# Patient Record
Sex: Female | Born: 1970 | Race: White | Hispanic: No | Marital: Single | Smoking: Never smoker
Health system: Southern US, Community
[De-identification: ages and names within clinical notes are randomized; demographics above are authoritative.]

---

## 1997-12-29 ENCOUNTER — Other Ambulatory Visit: Admission: RE | Admit: 1997-12-29 | Discharge: 1997-12-29 | Payer: Self-pay | Admitting: Obstetrics and Gynecology

## 1999-01-07 ENCOUNTER — Other Ambulatory Visit: Admission: RE | Admit: 1999-01-07 | Discharge: 1999-01-07 | Payer: Self-pay | Admitting: *Deleted

## 2000-01-14 ENCOUNTER — Other Ambulatory Visit: Admission: RE | Admit: 2000-01-14 | Discharge: 2000-01-14 | Payer: Self-pay | Admitting: *Deleted

## 2000-04-29 ENCOUNTER — Other Ambulatory Visit: Admission: RE | Admit: 2000-04-29 | Discharge: 2000-04-29 | Payer: Self-pay | Admitting: Obstetrics and Gynecology

## 2000-06-02 ENCOUNTER — Encounter: Admission: RE | Admit: 2000-06-02 | Discharge: 2000-08-31 | Payer: Self-pay | Admitting: Obstetrics and Gynecology

## 2000-09-29 ENCOUNTER — Inpatient Hospital Stay (HOSPITAL_COMMUNITY): Admission: AD | Admit: 2000-09-29 | Discharge: 2000-09-29 | Payer: Self-pay | Admitting: Obstetrics and Gynecology

## 2000-10-06 ENCOUNTER — Encounter (HOSPITAL_COMMUNITY): Admission: RE | Admit: 2000-10-06 | Discharge: 2000-10-25 | Payer: Self-pay | Admitting: Obstetrics and Gynecology

## 2000-10-23 ENCOUNTER — Inpatient Hospital Stay (HOSPITAL_COMMUNITY): Admission: AD | Admit: 2000-10-23 | Discharge: 2000-10-27 | Payer: Self-pay | Admitting: *Deleted

## 2000-10-28 ENCOUNTER — Encounter: Admission: RE | Admit: 2000-10-28 | Discharge: 2000-11-27 | Payer: Self-pay | Admitting: Obstetrics and Gynecology

## 2000-10-31 ENCOUNTER — Inpatient Hospital Stay (HOSPITAL_COMMUNITY): Admission: AD | Admit: 2000-10-31 | Discharge: 2000-10-31 | Payer: Self-pay | Admitting: Obstetrics and Gynecology

## 2000-11-28 ENCOUNTER — Encounter: Admission: RE | Admit: 2000-11-28 | Discharge: 2000-12-28 | Payer: Self-pay | Admitting: Obstetrics and Gynecology

## 2000-12-03 ENCOUNTER — Other Ambulatory Visit: Admission: RE | Admit: 2000-12-03 | Discharge: 2000-12-03 | Payer: Self-pay | Admitting: Obstetrics and Gynecology

## 2000-12-29 ENCOUNTER — Encounter: Admission: RE | Admit: 2000-12-29 | Discharge: 2001-01-28 | Payer: Self-pay | Admitting: Obstetrics and Gynecology

## 2001-12-23 ENCOUNTER — Other Ambulatory Visit: Admission: RE | Admit: 2001-12-23 | Discharge: 2001-12-23 | Payer: Self-pay | Admitting: Obstetrics and Gynecology

## 2003-03-24 ENCOUNTER — Other Ambulatory Visit: Admission: RE | Admit: 2003-03-24 | Discharge: 2003-03-24 | Payer: Self-pay | Admitting: Obstetrics and Gynecology

## 2011-04-14 ENCOUNTER — Ambulatory Visit (INDEPENDENT_AMBULATORY_CARE_PROVIDER_SITE_OTHER): Payer: PRIVATE HEALTH INSURANCE

## 2011-04-14 DIAGNOSIS — R509 Fever, unspecified: Secondary | ICD-10-CM

## 2011-04-14 DIAGNOSIS — J029 Acute pharyngitis, unspecified: Secondary | ICD-10-CM

## 2016-11-29 DIAGNOSIS — Z1231 Encounter for screening mammogram for malignant neoplasm of breast: Secondary | ICD-10-CM | POA: Diagnosis not present

## 2017-03-05 DIAGNOSIS — Z01419 Encounter for gynecological examination (general) (routine) without abnormal findings: Secondary | ICD-10-CM | POA: Diagnosis not present

## 2017-04-25 ENCOUNTER — Emergency Department (HOSPITAL_COMMUNITY)
Admission: EM | Admit: 2017-04-25 | Discharge: 2017-04-25 | Disposition: A | Discharge status: Home / Self Care | Payer: Commercial Managed Care - PPO | Attending: Emergency Medicine | Admitting: Emergency Medicine

## 2017-04-25 ENCOUNTER — Emergency Department (HOSPITAL_COMMUNITY): Payer: Commercial Managed Care - PPO

## 2017-04-25 ENCOUNTER — Encounter (HOSPITAL_COMMUNITY): Payer: Self-pay | Admitting: Obstetrics and Gynecology

## 2017-04-25 DIAGNOSIS — S61421A Laceration with foreign body of right hand, initial encounter: Secondary | ICD-10-CM | POA: Insufficient documentation

## 2017-04-25 DIAGNOSIS — S6991XA Unspecified injury of right wrist, hand and finger(s), initial encounter: Secondary | ICD-10-CM | POA: Diagnosis not present

## 2017-04-25 DIAGNOSIS — S6391XA Sprain of unspecified part of right wrist and hand, initial encounter: Secondary | ICD-10-CM

## 2017-04-25 DIAGNOSIS — Y999 Unspecified external cause status: Secondary | ICD-10-CM | POA: Insufficient documentation

## 2017-04-25 DIAGNOSIS — Y929 Unspecified place or not applicable: Secondary | ICD-10-CM | POA: Diagnosis not present

## 2017-04-25 DIAGNOSIS — S99911A Unspecified injury of right ankle, initial encounter: Secondary | ICD-10-CM | POA: Diagnosis not present

## 2017-04-25 DIAGNOSIS — T148XXA Other injury of unspecified body region, initial encounter: Secondary | ICD-10-CM

## 2017-04-25 DIAGNOSIS — Y9301 Activity, walking, marching and hiking: Secondary | ICD-10-CM | POA: Diagnosis not present

## 2017-04-25 DIAGNOSIS — M25531 Pain in right wrist: Secondary | ICD-10-CM | POA: Diagnosis not present

## 2017-04-25 DIAGNOSIS — S6981XA Other specified injuries of right wrist, hand and finger(s), initial encounter: Secondary | ICD-10-CM | POA: Diagnosis present

## 2017-04-25 DIAGNOSIS — M79641 Pain in right hand: Secondary | ICD-10-CM | POA: Diagnosis not present

## 2017-04-25 DIAGNOSIS — W010XXA Fall on same level from slipping, tripping and stumbling without subsequent striking against object, initial encounter: Secondary | ICD-10-CM | POA: Insufficient documentation

## 2017-04-25 MED ORDER — ACETAMINOPHEN 325 MG PO TABS
650.0000 mg | ORAL_TABLET | Freq: Once | ORAL | Status: AC
  Administered 2017-04-25: 650 mg via ORAL
  Filled 2017-04-25: qty 2

## 2017-04-25 NOTE — ED Triage Notes (Signed)
Pt was walking her dog and fell. Pt took aleve and has had ice on it. Pt has a laceration on the right hand and states she fell on the concrete. Pt reports pain shooting up to her elbow.  Pt able to move extremity but reports it is painful.

## 2017-04-25 NOTE — ED Notes (Signed)
Pt in xray

## 2017-04-25 NOTE — ED Provider Notes (Addendum)
[facility]-EMERGENCY DEPT Provider Note   CSN: [id number] Arrival date & time: 04/25/17  1412     History   Chief Complaint Chief Complaint  Patient presents with  . Fall  . Hand Injury    HPI Erin Rodgers is a 47 y.o. female.  HPI   Patient is a 47 year old female presenting for a fall onto her right hand while on concrete when she was walking her dog.  Patient reports that she did not have a syncopal episode.  Patient did not hit her head or obtain any other injury in this process.  Subsequently, patient has pain along the fifth metacarpal with minor swelling.  Patient reports that she has full sensation to her distal fingers, however she has decreased range of motion with opposition of the right fifth digit and thumb.  Patient does report that tetanus shot is up-to-date within 10 years.  Patient took Aleve and applied ice prior to arrival.  History reviewed. No pertinent past medical history.  There are no active problems to display for this patient.   History reviewed. No pertinent surgical history.  OB History    Gravida Para Term Preterm AB Living             2   SAB TAB Ectopic Multiple Live Births                   Home Medications    Prior to Admission medications   Not on File    Family History No family history on file.  Social History Social History   Tobacco Use  . Smoking status: Not on file  . Smokeless tobacco: Never Used  Substance Use Topics  . Alcohol use: No    Frequency: Never  . Drug use: No     Allergies   Penicillins   Review of Systems Review of Systems  Musculoskeletal: Positive for arthralgias and joint swelling.  Skin: Positive for color change and wound.  Neurological: Negative for weakness and numbness.     Physical Exam Updated Vital Signs BP 131/87 (BP Location: Left Arm)   Pulse 76   Temp 98.5 F (36.9 C) (Oral)   Resp (!) 22   Ht 5\' 1"  (1.549 m)   Wt 62.1 kg (137 lb)   LMP  04/22/2017   SpO2 97%   BMI 25.89 kg/m   Physical Exam  Constitutional: She appears well-developed and well-nourished. No distress.  Sitting comfortably in bed.  HENT:  Head: Normocephalic and atraumatic.  Eyes: Conjunctivae are normal. Right eye exhibits no discharge. Left eye exhibits no discharge.  EOMs normal to gross examination.  Neck: Normal range of motion.  Cardiovascular: Normal rate and regular rhythm.  Intact, 2+ radial and ulnar pulse of the right upper extremity.  Pulmonary/Chest:  Normal respiratory effort. Patient converses comfortably. No audible wheeze or stridor.  Abdominal: She exhibits no distension.  Musculoskeletal: Normal range of motion.  Hand Exam:  Inspection: There is abrasion over the lateral aspect of the palmar surface of the right hand.  There is minor abrasion on the palmar aspect of the right hand or the wrist.  Exploration reveals that the avulsion is only of the epidermal surface. Palpation: No point tenderness, crepitus, tenderness over anatomic snuffbox, or scapholunate joint tenderness ROM: Passive/active ROM intact at wrist, MCP, PIP, and DIP joints, thumb MCP and IP joints, and no rotational deformity of metacarpals noted. Ligamentous stability: No laxity to valgus/varus stress of MCP, PIP, or  DIP joints. No joint laxity with radial stress of thumb. Flexor/Extensor tendons: FDS/FDP tendons intact in digits 2-5 at PIP/DIP joints, respectively; extensor tendons intact in all digits Nerve testing:  -Radial: Thumb/wrist extension intact and 5/5 strength with resistance. Sensation to light touch intact at dorsal CMC joint. -Median: Sensation to light touch intact on palmar side of 1st and 2nd digits. -Ulnar: Sensation to light touch intact on palmar side of 5th digit.  Patient is able to distinguish sharp from dull in the right upper extremity. Vascular: 2+ radial and ulnar pulses. Capillary refill <2 seconds b/l.   Neurological: She is alert.    Cranial nerves intact to gross observation. Patient moves extremities without difficulty.  Skin: Skin is warm and dry. She is not diaphoretic.  Psychiatric: She has a normal mood and affect. Her behavior is normal. Judgment and thought content normal.  Nursing note and vitals reviewed.    ED Treatments / Results  Labs (all labs ordered are listed, but only abnormal results are displayed) Labs Reviewed - No data to display  EKG  EKG Interpretation None       Radiology Dg Wrist Complete Right  Result Date: 04/25/2017 CLINICAL DATA:  Fall, right hand/wrist pain EXAM: RIGHT WRIST - COMPLETE 3+ VIEW COMPARISON:  None. FINDINGS: No fracture or dislocation is seen. The joint spaces are preserved. Visualized soft tissues are within normal limits. IMPRESSION: Negative. Electronically Signed   By: Charline BillsSriyesh  Krishnan M.D.   On: 04/25/2017 18:15   Dg Hand Complete Right  Result Date: 04/25/2017 CLINICAL DATA:  Fall, right hand/wrist pain EXAM: RIGHT HAND - COMPLETE 3+ VIEW COMPARISON:  None. FINDINGS: No fracture or dislocation is seen. The joint spaces are preserved. Visualized soft tissues are within normal limits. IMPRESSION: Negative. Electronically Signed   By: Charline BillsSriyesh  Krishnan M.D.   On: 04/25/2017 18:15    Procedures Irrigation and debridement Date/Time: 04/25/2017 11:39 PM Performed by: Elisha PonderMurray, Loria Lacina B, PA-C Authorized by: Elisha PonderMurray, Dereonna Lensing B, PA-C  Consent: Verbal consent obtained. Consent given by: patient Patient understanding: patient states understanding of the procedure being performed Patient consent: the patient's understanding of the procedure matches consent given Imaging studies: imaging studies available Patient identity confirmed: verbally with patient Preparation: Patient declined lidocaine injection of area of avulsed skin prior to debridement. Local anesthesia used: no  Anesthesia: Local anesthesia used: no Patient tolerance: Patient tolerated the procedure well  with no immediate complications Comments: Areas of avulsion of epidermis of the right hand on the palmar aspect were debrided and small collections of gravel were removed and avulsed skin was irrigated.    (including critical care time)  Medications Ordered in ED Medications  acetaminophen (TYLENOL) tablet 650 mg (650 mg Oral Given 04/25/17 1748)     Initial Impression / Assessment and Plan / ED Course  I have reviewed the triage vital signs and the nursing notes.  Pertinent labs & imaging results that were available during my care of the patient were reviewed by me and considered in my medical decision making (see chart for details).     Final Clinical Impressions(s) / ED Diagnoses   Final diagnoses:  Sprain of right hand, initial encounter  Skin avulsion   Patient is nontoxic-appearing and in no acute distress.  There was no head injury or syncopal episode leading to this fall.  Patient has an avulsion injury of her palm.  X-ray reveals no radiopaque foreign body or metacarpal fracture.  Avulsions were debrided today.  Patient given Xeroform dressing  to assist in regranulation.  Patient to follow-up with her primary care provider she still having decreased range of motion of her hand .  Return precautions given for any signs of infection. Patient is understanding and agrees the plan of care.  ED Discharge Orders    None       Delia Chimes 04/25/17 1940    Rolland Porter, MD 04/25/17 2315    Aviva Kluver B, PA-C 04/25/17 Geanie Cooley    Rolland Porter, MD 04/25/17 [phone]

## 2017-04-25 NOTE — Discharge Instructions (Signed)
Please see the information and instructions below regarding your visit.  Your diagnoses today include:  1. Sprain of right hand, initial encounter   2. Skin avulsion    Your examination is reassuring today.  Only the top layer of the skin was taken off in this injury.  I took out the pieces of gravel.  We will place a dressing on her hand that will help regrow the tissue.  Tests performed today include: See side panel of your discharge paperwork for testing performed today. Vital signs are listed at the bottom of these instructions.   X-ray demonstrates no evidence of fracture or radiopaque foreign bodies.  Medications prescribed:    Take any prescribed medications only as prescribed, and any over the counter medications only as directed on the packaging.  You may alternate ibuprofen and Tylenol for your pain.  You may take ibuprofen 600 mg every 6 hours as well as Tylenol 650 mg every 6 hours.  Do not exceed 4 g of Tylenol in 1 day.  Home care instructions:  Please follow any educational materials contained in this packet.   Please only care for your wound with warm soap and water.  Please do not apply any chemicals such as hydrogen peroxide or alcohol.  Follow-up instructions: Please follow-up with your primary care provider in one week for further evaluation of your symptoms if they are not completely improved.   Return instructions:  Please return to the Emergency Department if you experience worsening symptoms.  Please return to the emergency department if you have any redness surrounding her wounds, drainage that is green or yellow, redness streaking up the arm, increasing swelling of your hand, or increasing pain, or any decreased range of motion of your hand. Please return if you have any other emergent concerns.  Additional Information:   Your vital signs today were: BP 131/87 (BP Location: Left Arm)    Pulse 76    Temp 98.5 F (36.9 C) (Oral)    Resp (!) 22    Ht 5\' 1"   (1.549 m)    Wt 62.1 kg (137 lb)    LMP 04/22/2017    SpO2 97%    BMI 25.89 kg/m  If your blood pressure (BP) was elevated on multiple readings during this visit above 130 for the top number or above 80 for the bottom number, please have this repeated by your primary care provider within one month. --------------  Thank you for allowing us to participate in your care today.

## 2017-04-29 DIAGNOSIS — R05 Cough: Secondary | ICD-10-CM | POA: Diagnosis not present

## 2017-08-13 DIAGNOSIS — J029 Acute pharyngitis, unspecified: Secondary | ICD-10-CM | POA: Diagnosis not present

## 2017-08-17 DIAGNOSIS — H1012 Acute atopic conjunctivitis, left eye: Secondary | ICD-10-CM | POA: Diagnosis not present

## 2017-10-07 DIAGNOSIS — H5203 Hypermetropia, bilateral: Secondary | ICD-10-CM | POA: Diagnosis not present

## 2017-10-07 DIAGNOSIS — H524 Presbyopia: Secondary | ICD-10-CM | POA: Diagnosis not present

## 2017-12-23 ENCOUNTER — Encounter (HOSPITAL_COMMUNITY): Payer: Self-pay | Admitting: Emergency Medicine

## 2017-12-23 ENCOUNTER — Emergency Department (HOSPITAL_COMMUNITY)
Admission: EM | Admit: 2017-12-23 | Discharge: 2017-12-24 | Disposition: A | Discharge status: Home / Self Care | Payer: Commercial Managed Care - PPO | Attending: Emergency Medicine | Admitting: Emergency Medicine

## 2017-12-23 DIAGNOSIS — S91352A Open bite, left foot, initial encounter: Secondary | ICD-10-CM | POA: Diagnosis not present

## 2017-12-23 DIAGNOSIS — T63001A Toxic effect of unspecified snake venom, accidental (unintentional), initial encounter: Secondary | ICD-10-CM

## 2017-12-23 DIAGNOSIS — Z79899 Other long term (current) drug therapy: Secondary | ICD-10-CM | POA: Insufficient documentation

## 2017-12-23 MED ORDER — OXYCODONE-ACETAMINOPHEN 5-325 MG PO TABS
1.0000 | ORAL_TABLET | Freq: Once | ORAL | Status: AC
  Administered 2017-12-23: 1 via ORAL
  Filled 2017-12-23: qty 1

## 2017-12-23 MED ORDER — KETOROLAC TROMETHAMINE 60 MG/2ML IM SOLN
30.0000 mg | Freq: Once | INTRAMUSCULAR | Status: AC
  Administered 2017-12-23: 30 mg via INTRAMUSCULAR
  Filled 2017-12-23: qty 2

## 2017-12-23 NOTE — ED Notes (Signed)
Rn assessed pt, pt reports she does not feel like the area has gotten any tighter and reports she has decreased pain.  RN did not observe any additional swelling up the ankle. Pt has good pulses in the left foot and good cap refill. Will continue to monitor.

## 2017-12-23 NOTE — ED Notes (Addendum)
Area of inflammation is 15cmX10cm Pt's calf is 31cm at this time. Area is warm to touch and extends to the ankle. Swelling prevalent

## 2017-12-23 NOTE — ED Notes (Signed)
Poison control suggestions: Loraine Leriche the area Clean the skin with soap and water Labs PT, INR, Fibrinogen, CBC drawn 6 hours after bite If the swelling progress past the joint, give anti-venom, if a compartment syndrome is suspected, give ati-venom before any fashiotomy Monitor pt for 8 hours due to lower extremity bite Keep leg straight and reassess bite every 82mins-1hour

## 2017-12-23 NOTE — ED Triage Notes (Signed)
Pt reports she was walking her dog and felt something stinging on her left foot. Thought was bee, saw a snake but doesn't remember what color or what it looked like.

## 2017-12-23 NOTE — ED Notes (Addendum)
Area of inflammation has spread up to the calf area. Difficult to measure area due to size. Calf circumference has increased to 33cm. NP aware at this time.  Pt reports pain has changed and is a constant dull ache with sharp shooting pain when touched.  Pt has WNL capillary refill to toes, but toes are swollen and pt reports them difficult to bend. Ankle is at 22cm in circumference RN felt heat up to mid shin and calf area.

## 2017-12-23 NOTE — ED Provider Notes (Signed)
[facility]-EMERGENCY DEPT Provider Note   CSN: [id number] Arrival date & time: 12/23/17  1848     History   Chief Complaint Chief Complaint  Patient presents with  . Snake Bite    HPI Erin Rodgers is a 47 y.o. female who presents to the ED for a snake bite. Patient reports she was walking her dog and felt something sting her on her left foot. At first she thought it was a bee but then saw the snake. Patient doesn't remember the color of the snake. The snake bite was to the left lateral foot. Patient reports it was a small snake.   HPI  History reviewed. No pertinent past medical history.  There are no active problems to display for this patient.   History reviewed. No pertinent surgical history.   OB History    Gravida      Para      Term      Preterm      AB      Living  2     SAB      TAB      Ectopic      Multiple      Live Births               Home Medications    Prior to Admission medications   Medication Sig Start Date End Date Taking? Authorizing Provider  levonorgestrel (MIRENA) 20 MCG/24HR IUD 1 each by Intrauterine route once.   Yes [provider]  methylphenidate (RITALIN) 20 MG tablet Take 20 mg by mouth daily. In the afternoon 12/02/17  Yes [provider]  methylphenidate 27 MG PO CR tablet Take 27 mg by mouth daily. In the morning 12/02/17  Yes [provider]  Multiple Vitamins-Minerals (WOMENS DAILY FORMULA PO) Take 1 tablet by mouth daily.   Yes [provider]  naproxen sodium (ALEVE) 220 MG tablet Take 440 mg by mouth daily as needed (pain).   Yes [provider]    Family History No family history on file.  Social History Social History   Tobacco Use  . Smoking status: Never Smoker  . Smokeless tobacco: Never Used  Substance Use Topics  . Alcohol use: No    Frequency: Never  . Drug use: No     Allergies   Penicillins   Review of  Systems Review of Systems  Musculoskeletal: Positive for joint swelling.  Skin: Positive for wound.  All other systems reviewed and are negative.    Physical Exam Updated Vital Signs BP 136/89   Pulse 86   Temp 98.7 F (37.1 C) (Oral)   Resp 15   SpO2 98%   Physical Exam  Constitutional: She appears well-developed and well-nourished. No distress.  HENT:  Head: Normocephalic.  Eyes: EOM are normal.  Neck: Neck supple.  Cardiovascular: Normal rate.  Pulmonary/Chest: Effort normal.  Abdominal: Soft. There is no tenderness.  Musculoskeletal: Normal range of motion.       Left foot: There is tenderness and swelling.       Feet:  Puncture wounds to left lateral foot with swelling and tenderness. Area of inflammation is 15cmX10cm Pt's calf is 31cm at this time. Area is warm to touch and extends to the ankle.   Neurological: She is alert.  Skin: Skin is warm and dry.  Psychiatric: She has a normal mood and affect. Her behavior is normal.  Nursing note and vitals reviewed.    ED  Treatments / Results  Labs  Wound cleaned, foot measured at area of swelling and lower leg measured. Will reassess every 30 minutes for changes.   (all labs ordered are listed, but only abnormal results are displayed) Labs Reviewed - No data to display  Radiology No results found.  Procedures Procedures (including critical care time)  Medications Ordered in ED Medications  oxyCODONE-acetaminophen (PERCOCET/ROXICET) 5-325 MG per tablet 1 tablet (has no administration in time range)  ketorolac (TORADOL) injection 30 mg (has no administration in time range)   Call to Poison Control and they suggest: Loraine Leriche the area Clean the skin with soap and water Labs PT, INR, Fibrinogen, CBC drawn 6 hours after bite If the swelling progress past the joint, give anti-venom, if a compartment syndrome is suspected, give ati-venom before any fashiotomy Monitor pt for 8 hours due to lower extremity bite Keep  leg straight and reassess bite every 37mins-1hour   Initial Impression / Assessment and Plan / ED Course  I have reviewed the triage vital signs and the nursing notes.  Care turned over to Dr. Rubin Payor. Patient continues to be observed.  Final Clinical Impressions(s) / ED Diagnoses   Final diagnoses:  Snake bite, initial encounter    ED Discharge Orders    None       Kerrie Buffalo [place], [place] 12/23/17 2223    Benjiman Core, MD 12/24/17 [phone]

## 2017-12-24 DIAGNOSIS — Z79899 Other long term (current) drug therapy: Secondary | ICD-10-CM | POA: Diagnosis not present

## 2017-12-24 DIAGNOSIS — T63001A Toxic effect of unspecified snake venom, accidental (unintentional), initial encounter: Secondary | ICD-10-CM | POA: Diagnosis not present

## 2017-12-24 LAB — PROTIME-INR
INR: 0.96
PROTHROMBIN TIME: 12.7 s (ref 11.4–15.2)

## 2017-12-24 LAB — FIBRINOGEN: Fibrinogen: 353 mg/dL (ref 210–475)

## 2017-12-24 LAB — CBC
HCT: 41 % (ref 36.0–46.0)
Hemoglobin: 14 g/dL (ref 12.0–15.0)
MCH: 30.8 pg (ref 26.0–34.0)
MCHC: 34.1 g/dL (ref 30.0–36.0)
MCV: 90.1 fL (ref 78.0–100.0)
PLATELETS: 264 10*3/uL (ref 150–400)
RBC: 4.55 MIL/uL (ref 3.87–5.11)
RDW: 13 % (ref 11.5–15.5)
WBC: 9.4 10*3/uL (ref 4.0–10.5)

## 2017-12-24 MED ORDER — OXYCODONE-ACETAMINOPHEN 5-325 MG PO TABS: 1.0000 | ORAL_TABLET | ORAL | 0 refills | Status: DC | PRN

## 2017-12-24 NOTE — ED Notes (Addendum)
Circumference of foot-19cm Ankle 22 Calf 33 Pt states that she's having sharp pains across her foot and her ankles and foot are very tight She still has feeling, pulses, and cap refill

## 2017-12-24 NOTE — ED Provider Notes (Signed)
12:31 AM Care assumed from Dr. Rubin Payor, patient with snakebite of her foot, probable copperhead bite.  Being observed in the ED for pain control and stability of labs.  1:33 AM  Pain is adequately controlled with oral oxycodone-acetaminophen.  Swelling has been stable.  Labs are reassuring.  Will need to observe until 4 AM.  If stable at that time will be able to discharge.  4:25 AM She continues to have good pain relief.  No worsening of swelling.  No systemic signs or symptoms.  She is safe for discharge.  Discharged with prescription for oxycodone-acetaminophen.  Return precautions discussed.  Results for orders placed or performed during the hospital encounter of 12/23/17  Protime-INR  Result Value Ref Range   Prothrombin Time 12.7 11.4 - 15.2 seconds   INR 0.96   Fibrinogen  Result Value Ref Range   Fibrinogen 353 210 - 475 mg/dL  CBC  Result Value Ref Range   WBC 9.4 4.0 - 10.5 K/uL   RBC 4.55 3.87 - 5.11 MIL/uL   Hemoglobin 14.0 12.0 - 15.0 g/dL   HCT 21.9 75.8 - 83.2 %   MCV 90.1 78.0 - 100.0 fL   MCH 30.8 26.0 - 34.0 pg   MCHC 34.1 30.0 - 36.0 g/dL   RDW 54.9 82.6 - 41.5 %   Platelets 264 150 - 400 K/uL     Erin Booze, MD 12/24/17 [phone]

## 2017-12-24 NOTE — Discharge Instructions (Addendum)
Return if pain is not being adequately controlled, or if you are having any worsening of symptoms.  Take acetaminophen and/or ibuprofen (or naproxen) for less severe pain.  You may take ibuprofen (or naproxen) in addition to oxycodone-acetaminohen for additional pain relief.

## 2017-12-31 DIAGNOSIS — T63004A Toxic effect of unspecified snake venom, undetermined, initial encounter: Secondary | ICD-10-CM | POA: Diagnosis not present

## 2017-12-31 DIAGNOSIS — M79672 Pain in left foot: Secondary | ICD-10-CM | POA: Diagnosis not present

## 2018-01-25 DIAGNOSIS — Z23 Encounter for immunization: Secondary | ICD-10-CM | POA: Diagnosis not present

## 2018-03-11 DIAGNOSIS — H1030 Unspecified acute conjunctivitis, unspecified eye: Secondary | ICD-10-CM | POA: Diagnosis not present

## 2018-03-11 DIAGNOSIS — J0100 Acute maxillary sinusitis, unspecified: Secondary | ICD-10-CM | POA: Diagnosis not present

## 2018-06-13 ENCOUNTER — Encounter (HOSPITAL_COMMUNITY): Payer: Self-pay | Admitting: Emergency Medicine

## 2018-06-13 ENCOUNTER — Ambulatory Visit (HOSPITAL_COMMUNITY)
Admission: EM | Admit: 2018-06-13 | Discharge: 2018-06-13 | Disposition: A | Discharge status: Home / Self Care | Payer: Commercial Managed Care - PPO | Attending: Internal Medicine | Admitting: Internal Medicine

## 2018-06-13 DIAGNOSIS — J111 Influenza due to unidentified influenza virus with other respiratory manifestations: Secondary | ICD-10-CM | POA: Diagnosis not present

## 2018-06-13 MED ORDER — OSELTAMIVIR PHOSPHATE 75 MG PO CAPS: 75.0000 mg | ORAL_CAPSULE | Freq: Two times a day (BID) | ORAL | 0 refills | Status: AC

## 2018-06-13 NOTE — ED Triage Notes (Signed)
Pt here for URI sx with congestion and HA

## 2018-06-13 NOTE — ED Provider Notes (Signed)
[facility]    CSN: [id number] Arrival date & time: 06/13/18  1625     History   Chief Complaint Chief Complaint  Patient presents with  . 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  Patient says the symptoms started fairly rapidly and is not improved over the past few days.  Her temperature was 101 Fahrenheit at home.  Sore throat was severe.  No aggravating factors.  No relieving factors.  It was associated with dysphagia.  No shortness of breath, wheezing or sputum production.   HPI  History reviewed. No pertinent past medical history.  There are no active problems to display for this patient.   History reviewed. No pertinent surgical history.  OB History    Gravida      Para      Term      Preterm      AB      Living  2     SAB      TAB      Ectopic      Multiple      Live Births               Home Medications    Prior to Admission medications   Medication Sig Start Date End Date Taking? Authorizing Provider  levonorgestrel (MIRENA) 20 MCG/24HR IUD 1 each by Intrauterine route once.    [provider]  methylphenidate (RITALIN) 20 MG tablet Take 20 mg by mouth daily. In the afternoon 12/02/17   [provider]  methylphenidate 27 MG PO CR tablet Take 27 mg by mouth daily. In the morning 12/02/17   [provider]  Multiple Vitamins-Minerals (WOMENS DAILY FORMULA PO) Take 1 tablet by mouth daily.    [provider]  naproxen sodium (ALEVE) 220 MG tablet Take 440 mg by mouth daily as needed (pain).    [provider]  oseltamivir (TAMIFLU) 75 MG capsule Take 1 capsule (75 mg total) by mouth every 12 (twelve) hours. 06/13/18   Rachit Grim, Britta Mccreedy, MD    Family History History reviewed. No pertinent  family history.  Social History Social History   Tobacco Use  . Smoking status: Never Smoker  . Smokeless tobacco: Never Used  Substance Use Topics  . Alcohol use: No    Frequency: Never  . Drug use: No     Allergies   Penicillins   Review of Systems Review of Systems  Constitutional: Positive for activity change and fatigue. Negative for appetite change, chills and fever.  HENT: Positive for congestion and rhinorrhea. Negative for ear discharge, ear pain, postnasal drip, sinus pressure and sinus pain.   Respiratory: Negative for cough, chest tightness and wheezing.   Cardiovascular: Negative for chest pain.  Gastrointestinal: Negative.   Genitourinary: Negative.   Musculoskeletal: Positive for arthralgias and myalgias.  Skin: Negative for rash and wound.  Neurological: Negative for dizziness, light-headedness and headaches.  Psychiatric/Behavioral: Negative for agitation, confusion and decreased concentration.     Physical Exam Triage Vital Signs ED Triage Vitals [06/13/18 1742]  Enc Vitals Group     BP 121/61     Pulse Rate 88     Resp 18     Temp 99.3 F (37.4 C)     Temp  Source Oral     SpO2 100 %     Weight      Height      Head Circumference      Peak Flow      Pain Score 5     Pain Loc      Pain Edu?      Excl. in GC?    No data found.  Updated Vital Signs BP 121/61 (BP Location: Right Arm)   Pulse 88   Temp 99.3 F (37.4 C) (Oral)   Resp 18   SpO2 100%   Visual Acuity Right Eye Distance:   Left Eye Distance:   Bilateral Distance:    Right Eye Near:   Left Eye Near:    Bilateral Near:     Physical Exam Vitals signs and nursing note reviewed.  Constitutional:      Appearance: She is normal weight. She is ill-appearing.  HENT:     Right Ear: Tympanic membrane normal.     Left Ear: Tympanic membrane normal.     Nose: Congestion present.     Mouth/Throat:     Mouth: Mucous membranes are moist.     Pharynx: Posterior oropharyngeal  erythema present.  Eyes:     Conjunctiva/sclera: Conjunctivae normal.  Neck:     Musculoskeletal: Normal range of motion. No neck rigidity.  Cardiovascular:     Rate and Rhythm: Normal rate and regular rhythm.     Pulses: Normal pulses.     Heart sounds: Normal heart sounds.  Pulmonary:     Effort: Pulmonary effort is normal.     Breath sounds: Normal breath sounds.  Abdominal:     General: Bowel sounds are normal.     Palpations: Abdomen is soft.  Musculoskeletal: Normal range of motion.  Lymphadenopathy:     Cervical: No cervical adenopathy.  Skin:    General: Skin is warm.     Capillary Refill: Capillary refill takes less than 2 seconds.     Coloration: Skin is not jaundiced.     Findings: No bruising.  Neurological:     General: No focal deficit present.     Mental Status: She is alert.      UC Treatments / Results  Labs (all labs ordered are listed, but only abnormal results are displayed) Labs Reviewed - No data to display  EKG None  Radiology No results found.  Procedures Procedures (including critical care time)  Medications Ordered in UC Medications - No data to display  Initial Impression / Assessment and Plan / UC Course  I have reviewed the triage vital signs and the nursing notes.  Pertinent labs & imaging results that were available during my care of the patient were reviewed by me and considered in my medical decision making (see chart for details).     1.  Influenza with respiratory symptoms: Tamiflu 75 mg twice daily for 5 days Tylenol/Motrin as needed for pain Encourage oral fluid intake  Final Clinical Impressions(s) / UC Diagnoses   Final diagnoses:  Influenza   Discharge Instructions   None    ED Prescriptions    Medication Sig Dispense Auth. Provider   oseltamivir (TAMIFLU) 75 MG capsule Take 1 capsule (75 mg total) by mouth every 12 (twelve) hours. 10 capsule Shellene Sweigert, Britta Mccreedy, MD     Controlled Substance  Prescriptions [place] Controlled Substance Registry consulted? No   Merrilee Jansky, MD 06/15/18 [phone]

## 2018-06-22 DIAGNOSIS — Z01419 Encounter for gynecological examination (general) (routine) without abnormal findings: Secondary | ICD-10-CM | POA: Diagnosis not present

## 2018-06-22 DIAGNOSIS — Z6825 Body mass index (BMI) 25.0-25.9, adult: Secondary | ICD-10-CM | POA: Diagnosis not present

## 2018-06-22 DIAGNOSIS — Z1231 Encounter for screening mammogram for malignant neoplasm of breast: Secondary | ICD-10-CM | POA: Diagnosis not present

## 2018-08-13 IMAGING — CR DG WRIST COMPLETE 3+V*R*
5 series · 5 of 5 positions shown · non-contrast
Comparison: None.

CLINICAL DATA: Fall, right hand/wrist pain

EXAM:
RIGHT WRIST - COMPLETE 3+ VIEW

[x wrist pa right]
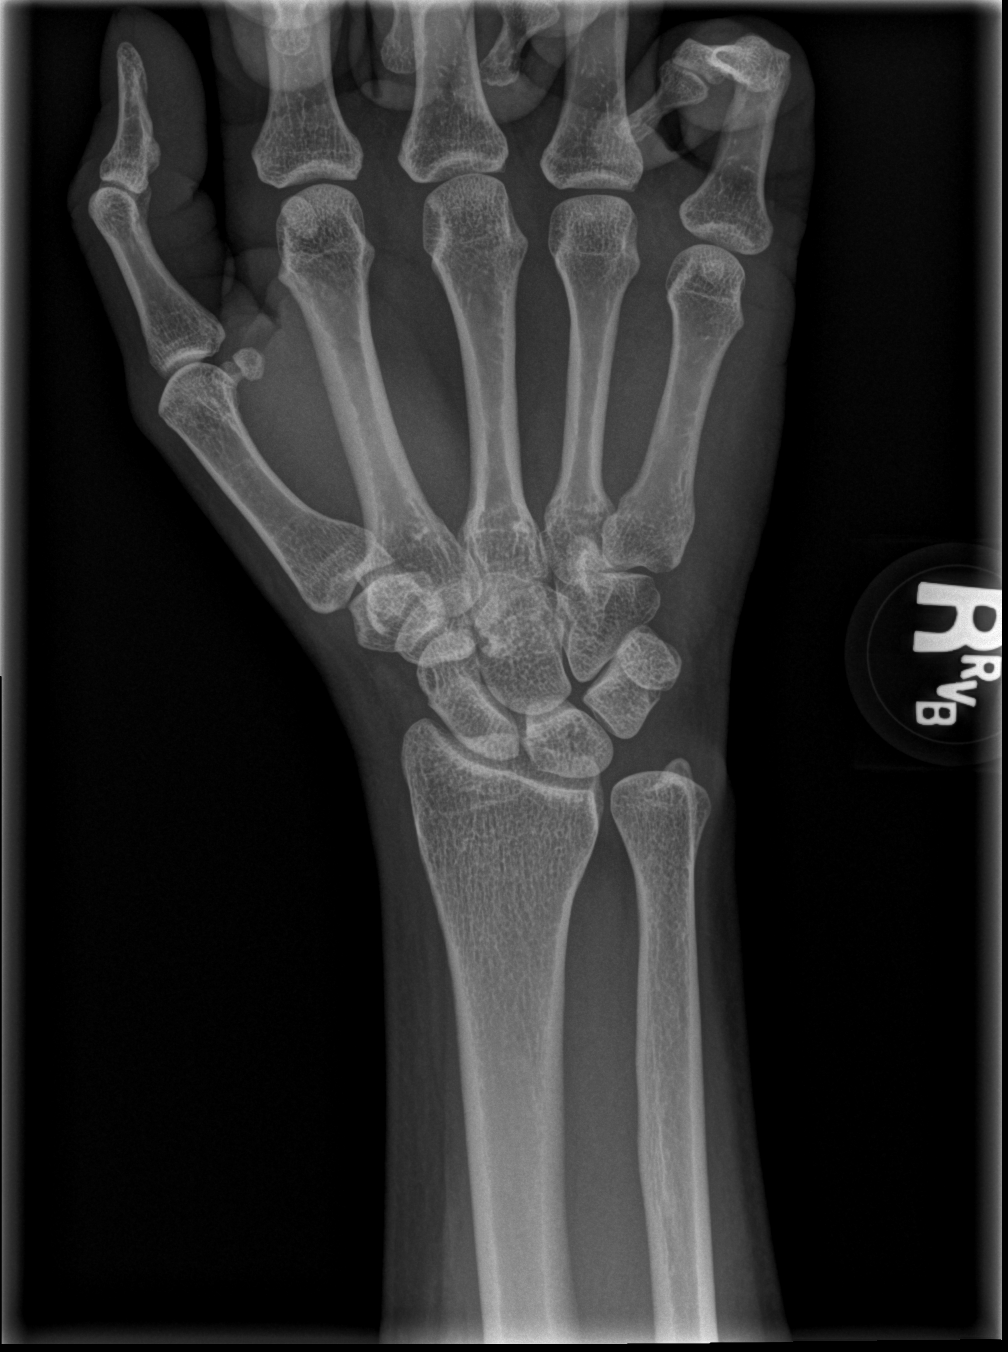

[x wrist obl right]
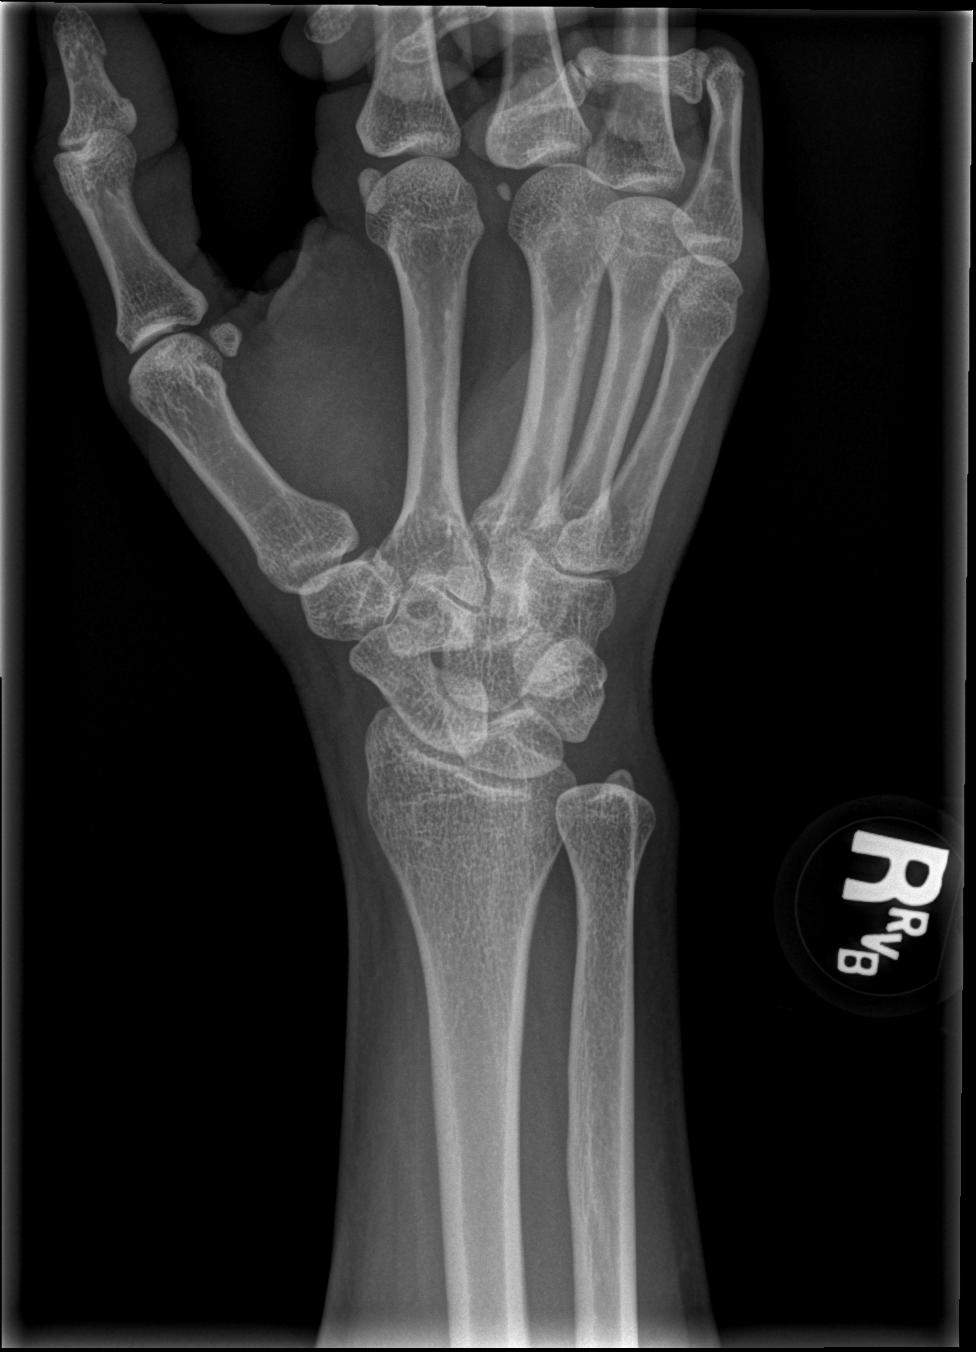

[x wrist lat right]
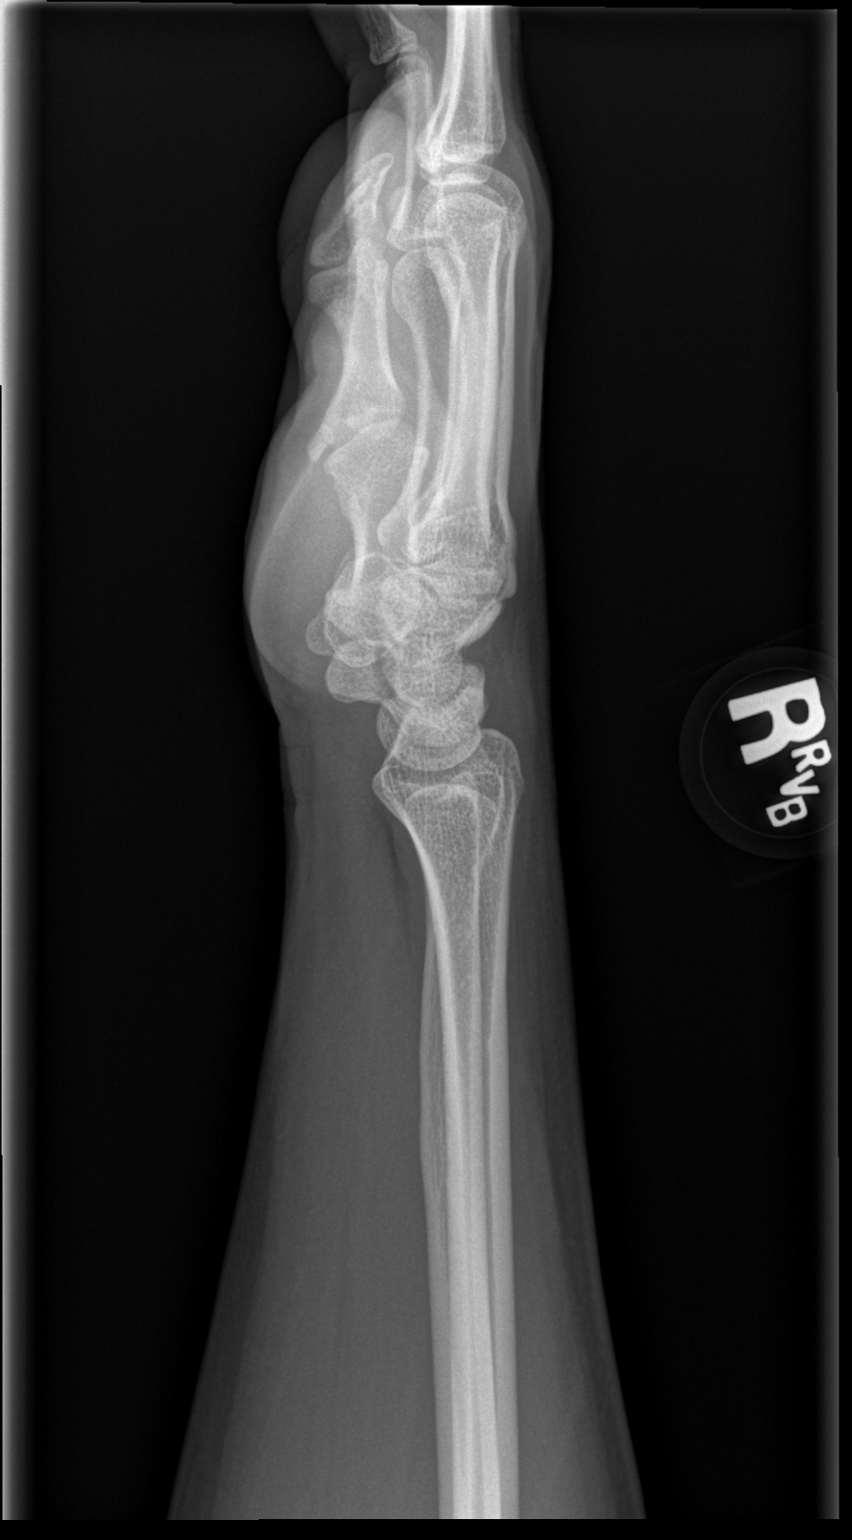

[x wrist navicular view right (1 of 2)]
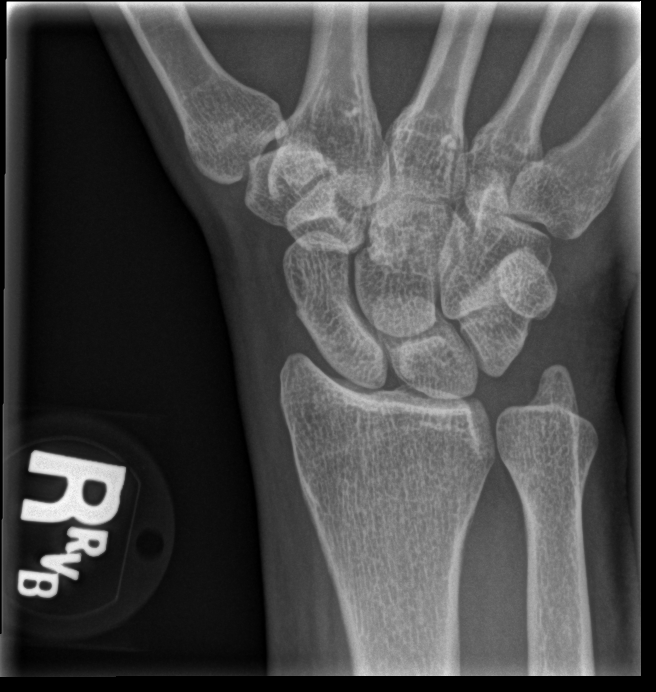

[x wrist navicular view right (2 of 2)]
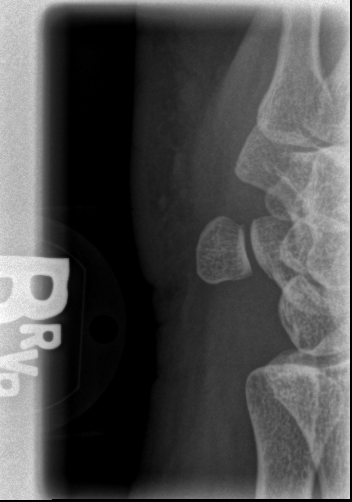

[5 of 5 positions shown; findings below may reference images not displayed]

FINDINGS: No fracture or dislocation is seen.

The joint spaces are preserved.

Visualized soft tissues are within normal limits.
IMPRESSION: Negative.

## 2019-03-30 ENCOUNTER — Other Ambulatory Visit: Payer: Self-pay

## 2019-03-30 DIAGNOSIS — Z20822 Contact with and (suspected) exposure to covid-19: Secondary | ICD-10-CM

## 2019-03-31 LAB — NOVEL CORONAVIRUS, NAA: SARS-CoV-2, NAA: NOT DETECTED

## 2019-06-13 ENCOUNTER — Ambulatory Visit: Payer: Commercial Managed Care - PPO

## 2020-02-04 ENCOUNTER — Ambulatory Visit: Payer: Commercial Managed Care - PPO | Attending: Internal Medicine

## 2020-02-04 DIAGNOSIS — Z23 Encounter for immunization: Secondary | ICD-10-CM

## 2020-02-04 NOTE — Progress Notes (Signed)
° °  [facility] Vaccination [facility]  Name:  MAISEE VOLLMAN    MRN: [id number] DOB: 04/17/1971  02/04/2020  Ms. Istre was observed post Covid-19 immunization for 30 minutes based on pre-vaccination screening without incident. She was provided with Vaccine Information Sheet and instruction to access the V-Safe system.   Ms. Benninger was instructed to call 911 with any severe reactions post vaccine:  Difficulty breathing   Swelling of face and throat   A fast heartbeat   A bad rash all over body   Dizziness and weakness
